# Patient Record
Sex: Male | Born: 1955 | Race: White | Hispanic: No | Marital: Married | Smoking: Former smoker
Health system: Southern US, Community
[De-identification: ages and names within clinical notes are randomized; demographics above are authoritative.]

## PROBLEM LIST (undated history)

## (undated) DIAGNOSIS — I10 Essential (primary) hypertension: Secondary | ICD-10-CM

## (undated) DIAGNOSIS — M109 Gout, unspecified: Secondary | ICD-10-CM

## (undated) DIAGNOSIS — M1990 Unspecified osteoarthritis, unspecified site: Secondary | ICD-10-CM

## (undated) DIAGNOSIS — E039 Hypothyroidism, unspecified: Secondary | ICD-10-CM

## (undated) HISTORY — PX: HERNIA REPAIR: SHX51

## (undated) HISTORY — PX: COLONOSCOPY: SHX174

---

## 2005-12-18 ENCOUNTER — Ambulatory Visit: Payer: Self-pay

## 2005-12-26 ENCOUNTER — Ambulatory Visit: Payer: Self-pay

## 2006-01-09 ENCOUNTER — Ambulatory Visit: Payer: Self-pay | Admitting: Internal Medicine

## 2007-01-23 IMAGING — CT NM PET TUM IMG LTD AREA
1 of 4 series · 19 of 25 positions shown · non-contrast
Comparison: none

REASON FOR EXAM: Pulmonary nodules
COMMENTS:

[Series 3: ct wb fusion · axial · 4.0mm · 0.98mm/px · z∈[-1018,-142]mm · 19 of 488 slices shown]
[im 25/488  soft-tissue]
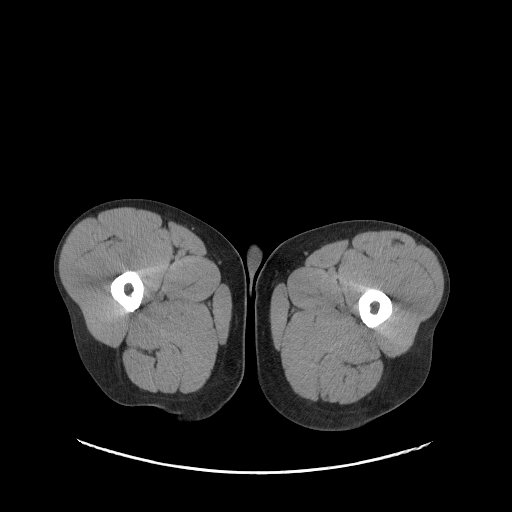
[im 49/488  soft-tissue]
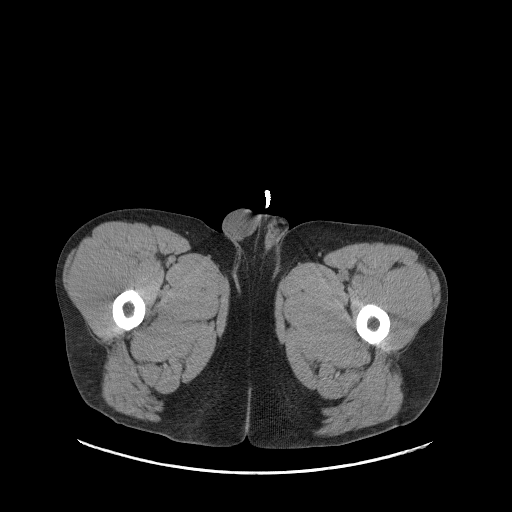
[im 74/488  soft-tissue]
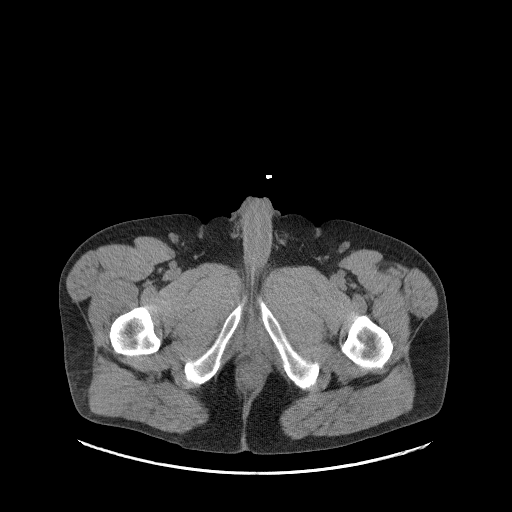
[im 98/488  soft-tissue]
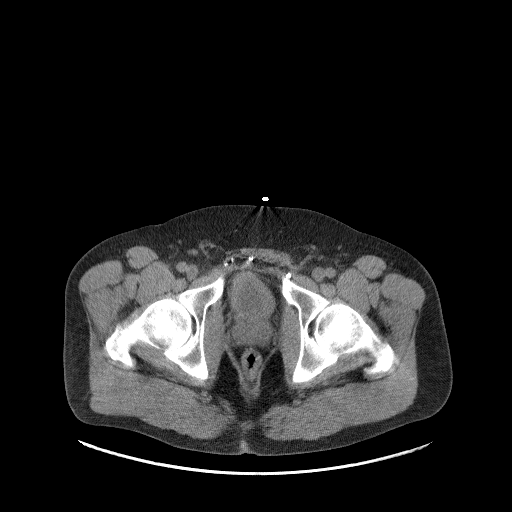
[im 122/488  soft-tissue]
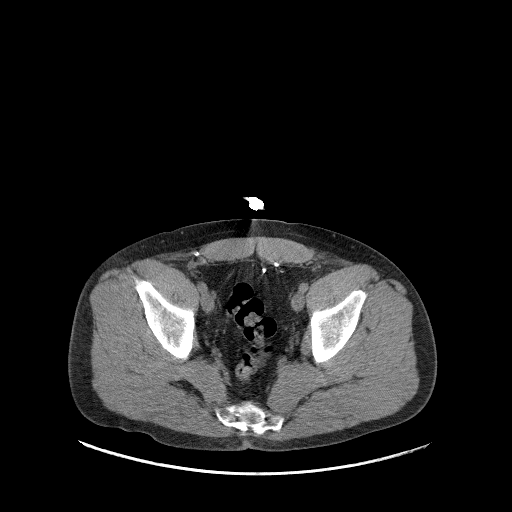
[im 147/488  soft-tissue]
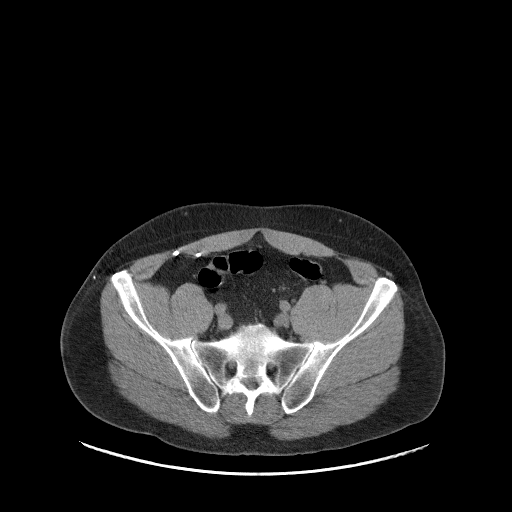
[im 171/488  soft-tissue]
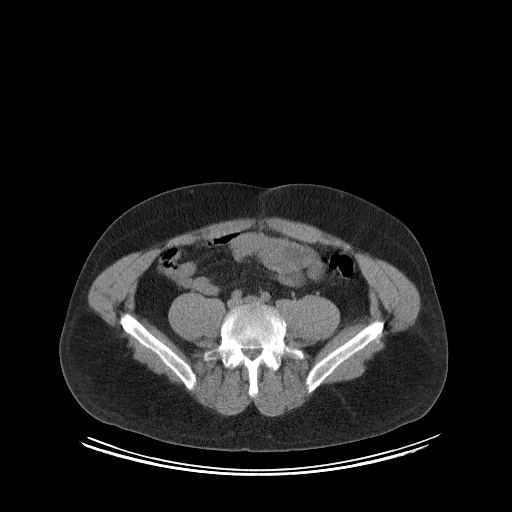
[im 195/488  soft-tissue]
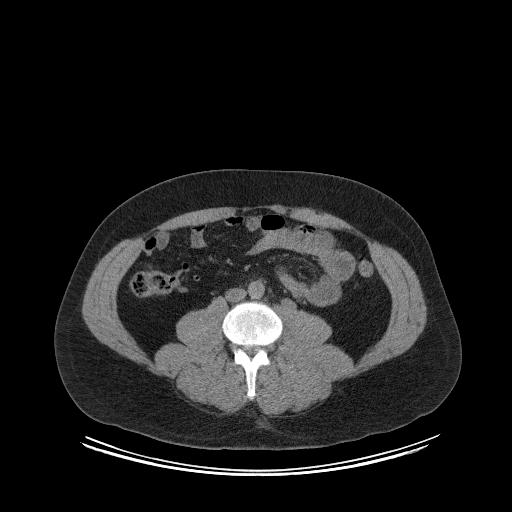
[im 220/488  soft-tissue]
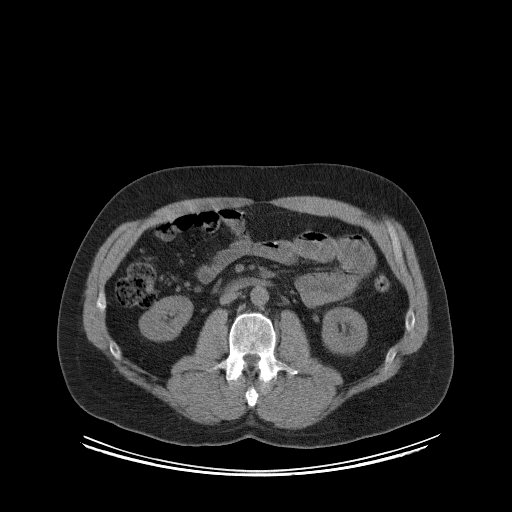
[im 244/488  soft-tissue]
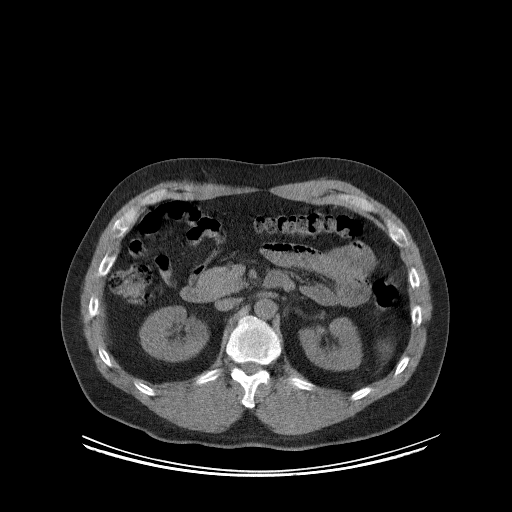
[im 268/488  soft-tissue]
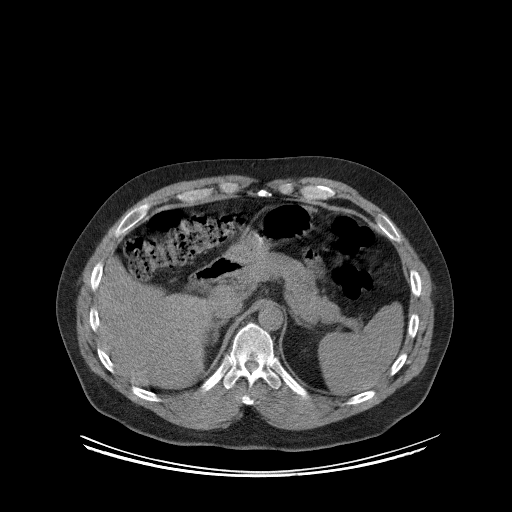
[im 293/488  soft-tissue]
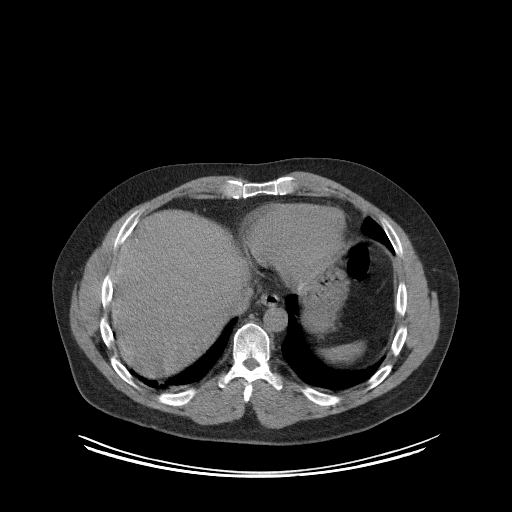
[im 317/488  soft-tissue]
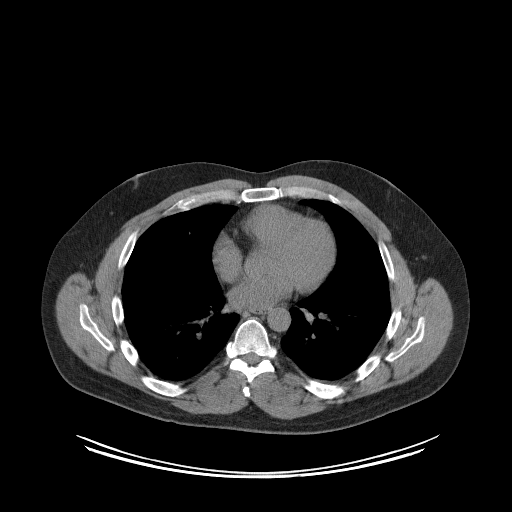
[im 341/488  soft-tissue]
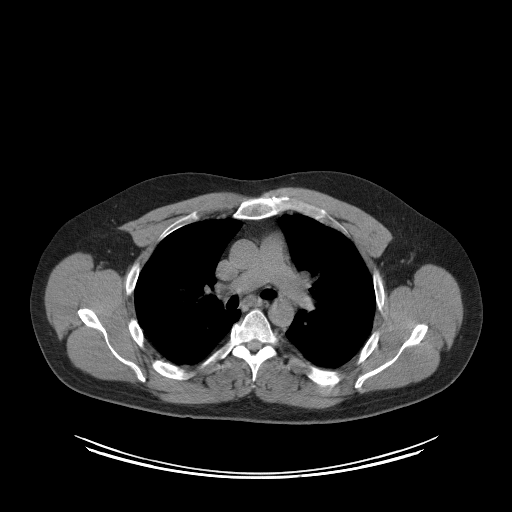
[im 366/488  soft-tissue]
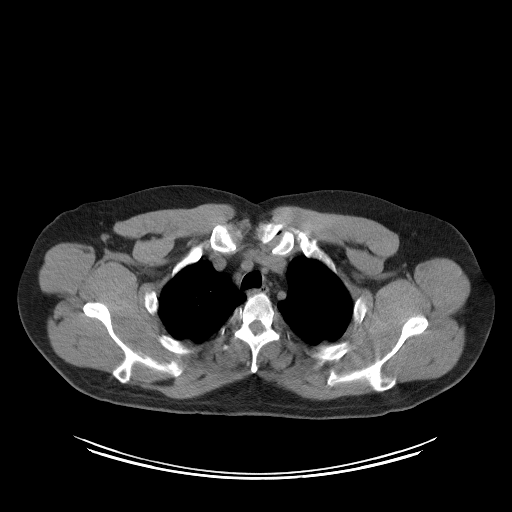
[im 390/488  soft-tissue]
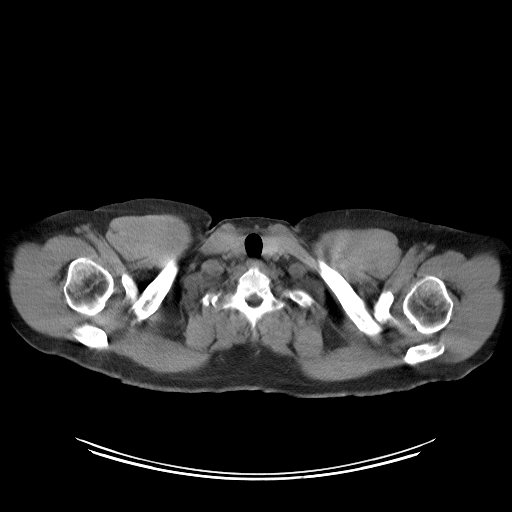
[im 414/488  soft-tissue]
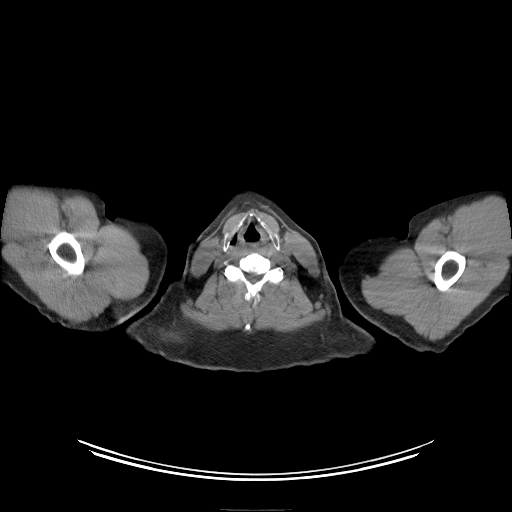
[im 439/488  soft-tissue]
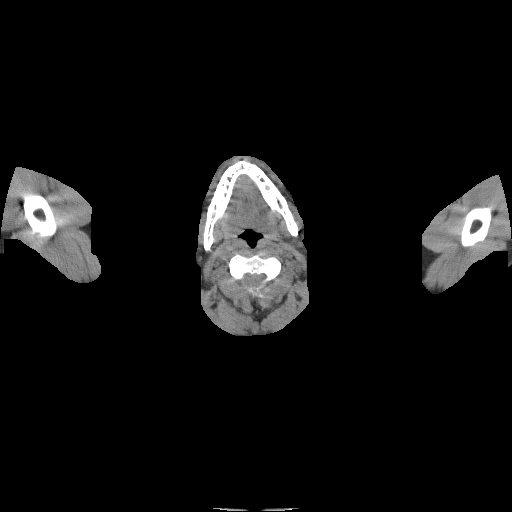
[im 463/488  soft-tissue]
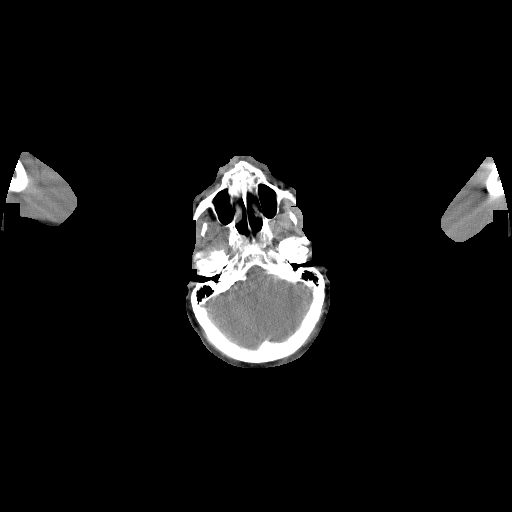

[19 of 25 positions shown; findings below may reference images not displayed]

PROCEDURE:     PET - PET/CT DX LUNG CA  - January 09, 2006  [DATE]

RESULT:     Following determination of a fasting blood sugar of 79 mg/dl, 16
mCi of F-18 FDG was administered  to the patient, and whole body PET CT with
delayed chest images was obtained.

No PET-positive abnormalities are identified.  Specifically, the chest,
including the chest delayed images, is normal.  Lymph nodes and density
noted on recent chest CT are PET-negative.
IMPRESSION: Negative exam.  No evidence of PET-positive
abnormality.

## 2009-05-02 ENCOUNTER — Ambulatory Visit: Payer: Self-pay | Admitting: Internal Medicine

## 2009-10-11 ENCOUNTER — Emergency Department: Payer: Self-pay | Admitting: Emergency Medicine

## 2010-07-09 ENCOUNTER — Ambulatory Visit: Payer: Self-pay | Admitting: Gastroenterology
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
## 2011-06-01 ENCOUNTER — Emergency Department: Payer: Self-pay | Admitting: Emergency Medicine

## 2014-10-10 ENCOUNTER — Ambulatory Visit: Payer: Self-pay | Admitting: Physician Assistant
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
## 2020-02-22 ENCOUNTER — Other Ambulatory Visit: Payer: Self-pay

## 2020-02-22 ENCOUNTER — Telehealth: Payer: Self-pay

## 2020-02-22 ENCOUNTER — Encounter: Payer: Self-pay | Admitting: Family Medicine

## 2020-02-22 DIAGNOSIS — Z1211 Encounter for screening for malignant neoplasm of colon: Secondary | ICD-10-CM

## 2020-02-22 NOTE — Telephone Encounter (Signed)
Gastroenterology Pre-Procedure Review  Request Date: Tuesday 03/13/20 Requesting Physician: Dr. Marius Ditch  PATIENT REVIEW QUESTIONS: The patient responded to the following health history questions as indicated:    1. Are you having any GI issues? no 2. Do you have a personal history of Polyps? no 3. Do you have a family history of Colon Cancer or Polyps? no 4. Diabetes Mellitus? no 5. Joint replacements in the past 12 months?no 6. Major health problems in the past 3 months?no 7. Any artificial heart valves, MVP, or defibrillator?no    MEDICATIONS & ALLERGIES:    Patient reports the following regarding taking any anticoagulation/antiplatelet therapy:   Plavix, Coumadin, Eliquis, Xarelto, Lovenox, Pradaxa, Brilinta, or Effient? no Aspirin? no  Patient confirms/reports the following medications:  No current outpatient medications on file.   No current facility-administered medications for this visit.    Patient confirms/reports the following allergies:  Not on File  No orders of the defined types were placed in this encounter.   AUTHORIZATION INFORMATION Primary Insurance: 1D#: Group #:  Secondary Insurance: 1D#: Group #:  SCHEDULE INFORMATION: Date: Tuesday 03/30/21Time: Location:[facility]

## 2020-03-07 ENCOUNTER — Encounter: Payer: Self-pay | Admitting: Gastroenterology

## 2020-03-07 ENCOUNTER — Other Ambulatory Visit: Payer: Self-pay

## 2020-03-09 ENCOUNTER — Other Ambulatory Visit
Admission: RE | Admit: 2020-03-09 | Discharge: 2020-03-09 | Disposition: A | Discharge status: Home / Self Care | Payer: BC Managed Care – PPO | Source: Ambulatory Visit | Attending: Gastroenterology | Admitting: Gastroenterology

## 2020-03-09 DIAGNOSIS — Z20822 Contact with and (suspected) exposure to covid-19: Secondary | ICD-10-CM | POA: Insufficient documentation

## 2020-03-09 DIAGNOSIS — Z01812 Encounter for preprocedural laboratory examination: Secondary | ICD-10-CM | POA: Diagnosis present

## 2020-03-09 LAB — SARS CORONAVIRUS 2 (TAT 6-24 HRS): SARS Coronavirus 2: NEGATIVE

## 2020-03-12 ENCOUNTER — Telehealth: Payer: Self-pay | Admitting: Gastroenterology

## 2020-03-12 NOTE — Telephone Encounter (Signed)
Patient called in & needs a different prep called into the [facility] in [place] for his colonoscopy tomorrow.

## 2020-03-12 NOTE — Anesthesia Preprocedure Evaluation (Addendum)
Anesthesia Evaluation  Patient identified by MRN, date of birth, ID band Patient awake    Reviewed: Allergy & Precautions, NPO status , Patient's Chart, lab work & pertinent test results  History of Anesthesia Complications Negative for: history of anesthetic complications  Airway Mallampati: II  TM Distance: >3 FB Neck ROM: Full    Dental   Pulmonary former smoker,    breath sounds clear to auscultation       Cardiovascular hypertension, (-) angina(-) DOE  Rhythm:Regular Rate:Normal     Neuro/Psych    GI/Hepatic neg GERD  ,  Endo/Other  Hypothyroidism   Renal/GU      Musculoskeletal  (+) Arthritis ,   Abdominal (+) + obese (BMI 36),   Peds  Hematology   Anesthesia Other Findings Gout  Reproductive/Obstetrics                            Anesthesia Physical Anesthesia Plan  ASA: II  Anesthesia Plan: General   Post-op Pain Management:    Induction: Intravenous  PONV Risk Score and Plan: 2 and Propofol infusion, TIVA and Treatment may vary due to age or medical condition  Airway Management Planned: Natural Airway and Nasal Cannula  Additional Equipment:   Intra-op Plan:   Post-operative Plan:   Informed Consent: I have reviewed the patients History and Physical, chart, labs and discussed the procedure including the risks, benefits and alternatives for the proposed anesthesia with the patient or authorized representative who has indicated his/her understanding and acceptance.       Plan Discussed with: CRNA and Anesthesiologist  Anesthesia Plan Comments:         Anesthesia Quick Evaluation

## 2020-03-12 NOTE — Discharge Instructions (Signed)
General Anesthesia, Adult, Care After This sheet gives you information about how to care for yourself after your procedure. Your health care provider may also give you more specific instructions. If you have problems or questions, contact your health care provider. What can I expect after the procedure? After the procedure, the following side effects are common:  Pain or discomfort at the IV site.  Nausea.  Vomiting.  Sore throat.  Trouble concentrating.  Feeling cold or chills.  Weak or tired.  Sleepiness and fatigue.  Soreness and body aches. These side effects can affect parts of the body that were not involved in surgery. Follow these instructions at home:  For at least 24 hours after the procedure:  Have a responsible adult stay with you. It is important to have someone help care for you until you are awake and alert.  Rest as needed.  Do not: ? Participate in activities in which you could fall or become injured. ? Drive. ? Use heavy machinery. ? Drink alcohol. ? Take sleeping pills or medicines that cause drowsiness. ? Make important decisions or sign legal documents. ? Take care of children on your own. Eating and drinking  Follow any instructions from your health care provider about eating or drinking restrictions.  When you feel hungry, start by eating small amounts of foods that are soft and easy to digest (bland), such as toast. Gradually return to your regular diet.  Drink enough fluid to keep your urine pale yellow.  If you vomit, rehydrate by drinking water, juice, or clear broth. General instructions  If you have sleep apnea, surgery and certain medicines can increase your risk for breathing problems. Follow instructions from your health care provider about wearing your sleep device: ? Anytime you are sleeping, including during daytime naps. ? While taking prescription pain medicines, sleeping medicines, or medicines that make you drowsy.  Return to  your normal activities as told by your health care provider. Ask your health care provider what activities are safe for you.  Take over-the-counter and prescription medicines only as told by your health care provider.  If you smoke, do not smoke without supervision.  Keep all follow-up visits as told by your health care provider. This is important. Contact a health care provider if:  You have nausea or vomiting that does not get better with medicine.  You cannot eat or drink without vomiting.  You have pain that does not get better with medicine.  You are unable to pass urine.  You develop a skin rash.  You have a fever.  You have redness around your IV site that gets worse. Get help right away if:  You have difficulty breathing.  You have chest pain.  You have blood in your urine or stool, or you vomit blood. Summary  After the procedure, it is common to have a sore throat or nausea. It is also common to feel tired.  Have a responsible adult stay with you for the first 24 hours after general anesthesia. It is important to have someone help care for you until you are awake and alert.  When you feel hungry, start by eating small amounts of foods that are soft and easy to digest (bland), such as toast. Gradually return to your regular diet.  Drink enough fluid to keep your urine pale yellow.  Return to your normal activities as told by your health care provider. Ask your health care provider what activities are safe for you. This information is not   intended to replace advice given to you by your health care provider. Make sure you discuss any questions you have with your health care provider. Document Revised: 12/04/2017 Document Reviewed: 07/17/2017 Elsevier Patient Education  2020 Elsevier Inc.  

## 2020-03-12 NOTE — Telephone Encounter (Signed)
Patient has been asked to stop by the office for bowel prep sample of PlenVu.  Thanks,  [employer]

## 2020-03-13 ENCOUNTER — Other Ambulatory Visit: Payer: Self-pay

## 2020-03-13 ENCOUNTER — Ambulatory Visit: Payer: BC Managed Care – PPO | Admitting: Anesthesiology

## 2020-03-13 ENCOUNTER — Ambulatory Visit
Admission: RE | Admit: 2020-03-13 | Discharge: 2020-03-13 | Disposition: A | Discharge status: Home / Self Care | Payer: BC Managed Care – PPO | Attending: Gastroenterology | Admitting: Gastroenterology

## 2020-03-13 ENCOUNTER — Encounter: Payer: Self-pay | Admitting: Gastroenterology

## 2020-03-13 ENCOUNTER — Encounter
Admission: RE | Disposition: A | Discharge status: Home / Self Care | Payer: Self-pay | Source: Home / Self Care | Attending: Gastroenterology

## 2020-03-13 DIAGNOSIS — K5730 Diverticulosis of large intestine without perforation or abscess without bleeding: Secondary | ICD-10-CM | POA: Diagnosis not present

## 2020-03-13 DIAGNOSIS — M109 Gout, unspecified: Secondary | ICD-10-CM | POA: Insufficient documentation

## 2020-03-13 DIAGNOSIS — Z6835 Body mass index (BMI) 35.0-35.9, adult: Secondary | ICD-10-CM | POA: Insufficient documentation

## 2020-03-13 DIAGNOSIS — D124 Benign neoplasm of descending colon: Secondary | ICD-10-CM | POA: Diagnosis not present

## 2020-03-13 DIAGNOSIS — E039 Hypothyroidism, unspecified: Secondary | ICD-10-CM | POA: Insufficient documentation

## 2020-03-13 DIAGNOSIS — E669 Obesity, unspecified: Secondary | ICD-10-CM | POA: Diagnosis not present

## 2020-03-13 DIAGNOSIS — K635 Polyp of colon: Secondary | ICD-10-CM | POA: Diagnosis not present

## 2020-03-13 DIAGNOSIS — Z79899 Other long term (current) drug therapy: Secondary | ICD-10-CM | POA: Insufficient documentation

## 2020-03-13 DIAGNOSIS — I10 Essential (primary) hypertension: Secondary | ICD-10-CM | POA: Diagnosis not present

## 2020-03-13 DIAGNOSIS — Z87891 Personal history of nicotine dependence: Secondary | ICD-10-CM | POA: Insufficient documentation

## 2020-03-13 DIAGNOSIS — M170 Bilateral primary osteoarthritis of knee: Secondary | ICD-10-CM | POA: Insufficient documentation

## 2020-03-13 DIAGNOSIS — Z1211 Encounter for screening for malignant neoplasm of colon: Secondary | ICD-10-CM | POA: Insufficient documentation

## 2020-03-13 HISTORY — DX: Hypothyroidism, unspecified: E03.9

## 2020-03-13 HISTORY — PX: POLYPECTOMY: SHX5525

## 2020-03-13 HISTORY — PX: COLONOSCOPY WITH PROPOFOL: SHX5780

## 2020-03-13 HISTORY — DX: Gout, unspecified: M10.9

## 2020-03-13 HISTORY — DX: Essential (primary) hypertension: I10

## 2020-03-13 HISTORY — DX: Unspecified osteoarthritis, unspecified site: M19.90

## 2020-03-13 SURGERY — COLONOSCOPY WITH PROPOFOL
Anesthesia: General

## 2020-03-13 MED ORDER — LACTATED RINGERS IV SOLN
100.0000 mL/h | INTRAVENOUS | Status: DC
  Administered 2020-03-13: 100 mL/h via INTRAVENOUS

## 2020-03-13 MED ORDER — ACETAMINOPHEN 10 MG/ML IV SOLN: 1000.0000 mg | Freq: Once | INTRAVENOUS | Status: DC | PRN

## 2020-03-13 MED ORDER — LIDOCAINE HCL (CARDIAC) PF 100 MG/5ML IV SOSY
PREFILLED_SYRINGE | INTRAVENOUS | Status: DC | PRN
  Administered 2020-03-13: 50 mg via INTRAVENOUS

## 2020-03-13 MED ORDER — ONDANSETRON HCL 4 MG/2ML IJ SOLN: 4.0000 mg | Freq: Once | INTRAMUSCULAR | Status: DC | PRN

## 2020-03-13 MED ORDER — STERILE WATER FOR IRRIGATION IR SOLN
Status: DC | PRN
  Administered 2020-03-13: 300 mL

## 2020-03-13 MED ORDER — PROPOFOL 10 MG/ML IV BOLUS
INTRAVENOUS | Status: DC | PRN
  Administered 2020-03-13 (×2): 100 mg via INTRAVENOUS

## 2020-03-13 SURGICAL SUPPLY — 10 items
CANISTER SUCT 1200ML W/VALVE (MISCELLANEOUS) ×4 IMPLANT
CLIP HMST 235XBRD CATH ROT (MISCELLANEOUS) IMPLANT
CLIP RESOLUTION 360 11X235 (MISCELLANEOUS)
FORCEPS BIOP RAD 4 LRG CAP 4 (CUTTING FORCEPS) IMPLANT
GOWN CVR UNV OPN BCK APRN NK (MISCELLANEOUS) ×4 IMPLANT
GOWN ISOL THUMB LOOP REG UNIV (MISCELLANEOUS) ×4
KIT ENDO PROCEDURE OLY (KITS) ×4 IMPLANT
SNARE COLD EXACTO (MISCELLANEOUS) ×4 IMPLANT
TRAP ETRAP POLY (MISCELLANEOUS) ×4 IMPLANT
WATER STERILE IRR 250ML POUR (IV SOLUTION) ×4 IMPLANT

## 2020-03-13 NOTE — H&P (Signed)
Cephas Darby, MD [address]  [place]  [place], [place] [address]  Main: [phone]  Fax: [phone] Pager: [phone]  Primary Care Physician:  Lynnell Jude, MD Primary Gastroenterologist:  Dr. Cephas Darby  Pre-Procedure History & Physical: HPI:  Jeremy Hurst is a 64 y.o. male is here for an colonoscopy.   Past Medical History:  Diagnosis Date  . Arthritis    knees  . Gout   . Hypertension   . Hypothyroidism     Past Surgical History:  Procedure Laterality Date  . COLONOSCOPY    . HERNIA REPAIR      Prior to Admission medications   Medication Sig Start Date End Date Taking? Authorizing Provider  irbesartan (AVAPRO) 75 MG tablet Take 75 mg by mouth daily.   Yes [provider]  levothyroxine (SYNTHROID) 125 MCG tablet Take 125 mcg by mouth daily before breakfast.   Yes [provider]  montelukast (SINGULAIR) 10 MG tablet Take 10 mg by mouth daily.   Yes [provider]    Allergies as of 02/22/2020  . (Not on File)    History reviewed. No pertinent family history.  Social History   Socioeconomic History  . Marital status: Married    Spouse name: Not on file  . Number of children: Not on file  . Years of education: Not on file  . Highest education level: Not on file  Occupational History  . Not on file  Tobacco Use  . Smoking status: Former Smoker    Types: Cigarettes    Quit date: 2009    Years since quitting: 12.2  . Smokeless tobacco: Never Used  Substance and Sexual Activity  . Alcohol use: Not Currently  . Drug use: Not on file  . Sexual activity: Not on file  Other Topics Concern  . Not on file  Social History Narrative  . Not on file   Social Determinants of Health   Financial Resource Strain:   . Difficulty of Paying Living Expenses:   Food Insecurity:   . Worried About Charity fundraiser in the Last Year:   . Arboriculturist in the Last Year:   Transportation Needs:   . Lexicographer (Medical):   Marland Kitchen Lack of Transportation (Non-Medical):   Physical Activity:   . Days of Exercise per Week:   . Minutes of Exercise per Session:   Stress:   . Feeling of Stress :   Social Connections:   . Frequency of Communication with Friends and Family:   . Frequency of Social Gatherings with Friends and Family:   . Attends Religious Services:   . Active Member of Clubs or Organizations:   . Attends Archivist Meetings:   Marland Kitchen Marital Status:   Intimate Partner Violence:   . Fear of Current or Ex-Partner:   . Emotionally Abused:   Marland Kitchen Physically Abused:   . Sexually Abused:     Review of Systems: See HPI, otherwise negative ROS  Physical Exam: BP 126/76   Pulse 76   Temp (!) 97.5 F (36.4 C) (Temporal)   Ht 5\' 11"  (1.803 m)   Wt 113.9 kg   SpO2 99%   BMI 35.01 kg/m  General:   Alert,  pleasant and cooperative in NAD Head:  Normocephalic and atraumatic. Neck:  Supple; no masses or thyromegaly. Lungs:  Clear throughout to auscultation.    Heart:  Regular rate and rhythm. Abdomen:  Soft, nontender  and nondistended. Normal bowel sounds, without guarding, and without rebound.   Neurologic:  Alert and  oriented x4;  grossly normal neurologically.  Impression/Plan: Jeremy Hurst is here for an colonoscopy to be performed for colon cancer screening  Risks, benefits, limitations, and alternatives regarding  colonoscopy have been reviewed with the patient.  Questions have been answered.  All parties agreeable.   Sherri Sear, MD  03/13/2020, 8:06 AM

## 2020-03-13 NOTE — Op Note (Signed)
[facility] Gastroenterology Patient Name: Jeremy Hurst Procedure Date: 03/13/2020 7:59 AM MRN: [id number] Account #: [id number] Date of Birth: 1956-04-22 Admit Type: Outpatient Age: 64 Room: [facility] OR ROOM 01 Gender: Male Note Status: Finalized Procedure:             Colonoscopy Indications:           Screening for colorectal malignant neoplasm, Last                         colonoscopy: July 2011 Providers:             Lin Landsman MD, MD Referring MD:          Lynnell Jude (Referring MD) Medicines:             Monitored Anesthesia Care Complications:         No immediate complications. Estimated blood loss: None. Procedure:             Pre-Anesthesia Assessment:                        - Prior to the procedure, a History and Physical was                         performed, and patient medications and allergies were                         reviewed. The patient is competent. The risks and                         benefits of the procedure and the sedation options and                         risks were discussed with the patient. All questions                         were answered and informed consent was obtained.                         Patient identification and proposed procedure were                         verified by the physician, the nurse, the                         anesthesiologist, the anesthetist and the technician                         in the pre-procedure area in the procedure room in the                         endoscopy suite. Mental Status Examination: alert and                         oriented. Airway Examination: normal oropharyngeal                         airway and neck mobility. Respiratory Examination:  clear to auscultation. CV Examination: normal.                         Prophylactic Antibiotics: The patient does not require                         prophylactic antibiotics. Prior Anticoagulants: The                    patient has taken no previous anticoagulant or                         antiplatelet agents. ASA Grade Assessment: II - A                         patient with mild systemic disease. After reviewing                         the risks and benefits, the patient was deemed in                         satisfactory condition to undergo the procedure. The                         anesthesia plan was to use monitored anesthesia care                         (MAC). Immediately prior to administration of                         medications, the patient was re-assessed for adequacy                         to receive sedatives. The heart rate, respiratory                         rate, oxygen saturations, blood pressure, adequacy of                         pulmonary ventilation, and response to care were                         monitored throughout the procedure. The physical                         status of the patient was re-assessed after the                         procedure.                        After obtaining informed consent, the colonoscope was                         passed under direct vision. Throughout the procedure,                         the patient's blood pressure, pulse, and oxygen  saturations were monitored continuously. The was                         introduced through the anus and advanced to the the                         terminal ileum, with identification of the appendiceal                         orifice and IC valve. The colonoscopy was performed                         without difficulty. The patient tolerated the                         procedure well. The quality of the bowel preparation                         was evaluated using the BBPS [facility] Bowel Preparation                         Scale) with scores of: Right Colon = 3, Transverse                         Colon = 3 and Left Colon = 3 (entire mucosa seen well                          with no residual staining, small fragments of stool or                         opaque liquid). The total BBPS score equals 9. Findings:      The perianal and digital rectal examinations were normal. Pertinent       negatives include normal sphincter tone and no palpable rectal lesions.      The terminal ileum appeared normal.      Two sessile polyps were found in the sigmoid colon and descending colon.       The polyps were 5 mm in size. These polyps were removed with a cold       snare. Resection and retrieval were complete.      Multiple diverticula were found in the sigmoid colon.      The retroflexed view of the distal rectum and anal verge was normal and       showed no anal or rectal abnormalities.      The exam was otherwise without abnormality. Impression:            - The examined portion of the ileum was normal.                        - Two 5 mm polyps in the sigmoid colon and in the                         descending colon, removed with a cold snare. Resected                         and retrieved.                        -  Diverticulosis in the sigmoid colon.                        - The distal rectum and anal verge are normal on                         retroflexion view.                        - The examination was otherwise normal. Recommendation:        - Discharge patient to home (with escort).                        - Resume previous diet today.                        - Continue present medications.                        - Await pathology results.                        - Repeat colonoscopy in 7 years for surveillance. Procedure Code(s):     --- Professional ---                        [phone], Colonoscopy, flexible; with removal of                         tumor(s), polyp(s), or other lesion(s) by snare                         technique Diagnosis Code(s):     --- Professional ---                        Z12.11, Encounter for screening for malignant neoplasm                          of colon                        K63.5, Polyp of colon                        K57.30, Diverticulosis of large intestine without                         perforation or abscess without bleeding CPT copyright 2019 American Medical Association. All rights reserved. The codes documented in this report are preliminary and upon coder review may  be revised to meet current compliance requirements. Dr. Ulyess Mort Lin Landsman MD, MD 03/13/2020 8:38:17 AM This report has been signed electronically. Number of Addenda: 0 Note Initiated On: 03/13/2020 7:59 AM Scope Withdrawal Time: 0 hours 13 minutes 55 seconds  Total Procedure Duration: 0 hours 16 minutes 8 seconds  Estimated Blood Loss:  Estimated blood loss: none.      [facility]

## 2020-03-13 NOTE — Anesthesia Procedure Notes (Signed)
Procedure Name: General with mask airway Date/Time: 03/13/2020 8:16 AM Performed by: Jeannene Patella, CRNA Pre-anesthesia Checklist: Timeout performed, Patient being monitored, Suction available, Emergency Drugs available and Patient identified Patient Re-evaluated:Patient Re-evaluated prior to induction Oxygen Delivery Method: Nasal cannula

## 2020-03-13 NOTE — Anesthesia Postprocedure Evaluation (Signed)
Anesthesia Post Note  Patient: Jeremy Hurst  Procedure(s) Performed: COLONOSCOPY WITH PROPOFOL (N/A ) POLYPECTOMY     Patient location during evaluation: PACU Anesthesia Type: General Level of consciousness: awake and alert Pain management: pain level controlled Vital Signs Assessment: post-procedure vital signs reviewed and stable Respiratory status: spontaneous breathing, nonlabored ventilation, respiratory function stable and patient connected to nasal cannula oxygen Cardiovascular status: blood pressure returned to baseline and stable Postop Assessment: no apparent nausea or vomiting Anesthetic complications: no    Lorimer Tiberio A  Octa Uplinger

## 2020-03-13 NOTE — Transfer of Care (Signed)
Immediate Anesthesia Transfer of Care Note  Patient: Jeremy Hurst  Procedure(s) Performed: COLONOSCOPY WITH PROPOFOL (N/A ) POLYPECTOMY  Patient Location: PACU  Anesthesia Type: General  Level of Consciousness: awake, alert  and patient cooperative  Airway and Oxygen Therapy: Patient Spontanous Breathing and Patient connected to supplemental oxygen  Post-op Assessment: Post-op Vital signs reviewed, Patient's Cardiovascular Status Stable, Respiratory Function Stable, Patent Airway and No signs of Nausea or vomiting  Post-op Vital Signs: Reviewed and stable  Complications: No apparent anesthesia complications

## 2020-03-14 ENCOUNTER — Encounter: Payer: Self-pay | Admitting: Gastroenterology

## 2020-03-14 ENCOUNTER — Encounter: Payer: Self-pay | Admitting: *Deleted

## 2020-03-14 LAB — SURGICAL PATHOLOGY
